# Patient Record
Sex: Male | Born: 1955 | Race: Black or African American | Hispanic: No | Smoking: Current every day smoker
Health system: Southern US, Community
[De-identification: ages and names within clinical notes are randomized; demographics above are authoritative.]

## PROBLEM LIST (undated history)

## (undated) DIAGNOSIS — T7840XA Allergy, unspecified, initial encounter: Secondary | ICD-10-CM

## (undated) DIAGNOSIS — G43909 Migraine, unspecified, not intractable, without status migrainosus: Secondary | ICD-10-CM

## (undated) DIAGNOSIS — R569 Unspecified convulsions: Secondary | ICD-10-CM

---

## 2014-04-15 ENCOUNTER — Emergency Department (HOSPITAL_COMMUNITY)
Admission: EM | Admit: 2014-04-15 | Discharge: 2014-04-15 | Disposition: A | Discharge status: Home / Self Care | Payer: Medicare HMO | Attending: Emergency Medicine | Admitting: Emergency Medicine

## 2014-04-15 ENCOUNTER — Encounter (HOSPITAL_COMMUNITY): Payer: Self-pay | Admitting: Emergency Medicine

## 2014-04-15 ENCOUNTER — Emergency Department (HOSPITAL_COMMUNITY): Payer: Medicare HMO

## 2014-04-15 DIAGNOSIS — G40909 Epilepsy, unspecified, not intractable, without status epilepticus: Secondary | ICD-10-CM | POA: Insufficient documentation

## 2014-04-15 DIAGNOSIS — R0981 Nasal congestion: Secondary | ICD-10-CM | POA: Diagnosis not present

## 2014-04-15 DIAGNOSIS — R51 Headache: Secondary | ICD-10-CM | POA: Diagnosis present

## 2014-04-15 DIAGNOSIS — G43909 Migraine, unspecified, not intractable, without status migrainosus: Secondary | ICD-10-CM | POA: Diagnosis not present

## 2014-04-15 DIAGNOSIS — Z79899 Other long term (current) drug therapy: Secondary | ICD-10-CM | POA: Diagnosis not present

## 2014-04-15 DIAGNOSIS — Z720 Tobacco use: Secondary | ICD-10-CM | POA: Insufficient documentation

## 2014-04-15 DIAGNOSIS — Z7951 Long term (current) use of inhaled steroids: Secondary | ICD-10-CM | POA: Insufficient documentation

## 2014-04-15 DIAGNOSIS — L299 Pruritus, unspecified: Secondary | ICD-10-CM | POA: Diagnosis not present

## 2014-04-15 DIAGNOSIS — R519 Headache, unspecified: Secondary | ICD-10-CM

## 2014-04-15 HISTORY — DX: Allergy, unspecified, initial encounter: T78.40XA

## 2014-04-15 HISTORY — DX: Migraine, unspecified, not intractable, without status migrainosus: G43.909

## 2014-04-15 HISTORY — DX: Unspecified convulsions: R56.9

## 2014-04-15 LAB — COMPREHENSIVE METABOLIC PANEL
ALBUMIN: 4.2 g/dL (ref 3.5–5.2)
ALT: 10 U/L (ref 0–53)
AST: 34 U/L (ref 0–37)
Alkaline Phosphatase: 56 U/L (ref 39–117)
Anion gap: 7 (ref 5–15)
BILIRUBIN TOTAL: 1.4 mg/dL — AB (ref 0.3–1.2)
BUN: 11 mg/dL (ref 6–23)
CALCIUM: 9.4 mg/dL (ref 8.4–10.5)
CO2: 25 mmol/L (ref 19–32)
Chloride: 109 mmol/L (ref 96–112)
Creatinine, Ser: 1.06 mg/dL (ref 0.50–1.35)
GFR calc Af Amer: 88 mL/min — ABNORMAL LOW (ref >90)
GFR calc non Af Amer: 76 mL/min — ABNORMAL LOW (ref >90)
Glucose, Bld: 84 mg/dL (ref 70–99)
POTASSIUM: 7 mmol/L — AB (ref 3.5–5.1)
Sodium: 141 mmol/L (ref 135–145)
Total Protein: 7.1 g/dL (ref 6.0–8.3)

## 2014-04-15 LAB — CBC WITH DIFFERENTIAL/PLATELET
Basophils Absolute: 0 10*3/uL (ref 0.0–0.1)
Basophils Relative: 0 % (ref 0–1)
Eosinophils Absolute: 0 10*3/uL (ref 0.0–0.7)
Eosinophils Relative: 0 % (ref 0–5)
HCT: 46.6 % (ref 39.0–52.0)
HEMOGLOBIN: 16.7 g/dL (ref 13.0–17.0)
LYMPHS PCT: 38 % (ref 12–46)
Lymphs Abs: 1.2 10*3/uL (ref 0.7–4.0)
MCH: 34.6 pg — AB (ref 26.0–34.0)
MCHC: 35.8 g/dL (ref 30.0–36.0)
MCV: 96.5 fL (ref 78.0–100.0)
MONO ABS: 0.4 10*3/uL (ref 0.1–1.0)
Monocytes Relative: 14 % — ABNORMAL HIGH (ref 3–12)
NEUTROS PCT: 48 % (ref 43–77)
Neutro Abs: 1.6 10*3/uL — ABNORMAL LOW (ref 1.7–7.7)
Platelets: 145 10*3/uL — ABNORMAL LOW (ref 150–400)
RBC: 4.83 MIL/uL (ref 4.22–5.81)
RDW: 13.9 % (ref 11.5–15.5)
WBC: 3.3 10*3/uL — AB (ref 4.0–10.5)

## 2014-04-15 LAB — POTASSIUM: POTASSIUM: 4.2 mmol/L (ref 3.5–5.1)

## 2014-04-15 MED ORDER — FENTANYL CITRATE 0.05 MG/ML IJ SOLN
25.0000 ug | Freq: Once | INTRAMUSCULAR | Status: AC
  Administered 2014-04-15: 25 ug via INTRAVENOUS
  Filled 2014-04-15: qty 2

## 2014-04-15 MED ORDER — TETRACAINE HCL 0.5 % OP SOLN
2.0000 [drp] | Freq: Once | OPHTHALMIC | Status: AC
  Administered 2014-04-15: 2 [drp] via OPHTHALMIC
  Filled 2014-04-15: qty 2

## 2014-04-15 MED ORDER — TRAMADOL HCL 50 MG PO TABS: 50.0000 mg | ORAL_TABLET | Freq: Four times a day (QID) | ORAL | Status: AC | PRN

## 2014-04-15 MED ORDER — NAPROXEN 500 MG PO TABS: 500.0000 mg | ORAL_TABLET | Freq: Two times a day (BID) | ORAL | Status: AC

## 2014-04-15 MED ORDER — METOCLOPRAMIDE HCL 5 MG/ML IJ SOLN
10.0000 mg | Freq: Once | INTRAMUSCULAR | Status: AC
  Administered 2014-04-15: 10 mg via INTRAVENOUS
  Filled 2014-04-15: qty 2

## 2014-04-15 MED ORDER — KETOROLAC TROMETHAMINE 30 MG/ML IJ SOLN
30.0000 mg | Freq: Once | INTRAMUSCULAR | Status: AC
  Administered 2014-04-15: 30 mg via INTRAVENOUS
  Filled 2014-04-15: qty 1

## 2014-04-15 MED ORDER — DIPHENHYDRAMINE HCL 50 MG/ML IJ SOLN
12.5000 mg | Freq: Once | INTRAMUSCULAR | Status: AC
  Administered 2014-04-15: 12.5 mg via INTRAVENOUS
  Filled 2014-04-15: qty 1

## 2014-04-15 MED ORDER — OXYCODONE-ACETAMINOPHEN 5-325 MG PO TABS
1.0000 | ORAL_TABLET | Freq: Once | ORAL | Status: AC
  Administered 2014-04-15: 1 via ORAL
  Filled 2014-04-15: qty 1

## 2014-04-15 NOTE — ED Notes (Signed)
States pain is easing and he has been falling asleep but when we knock on the door the pain comes back.

## 2014-04-15 NOTE — ED Notes (Signed)
Pt. Stated, I woke up around 200 for headache and its still there A couple of BC no help.

## 2014-04-15 NOTE — ED Notes (Signed)
Patient transported to CT 

## 2014-04-15 NOTE — ED Provider Notes (Signed)
CSN: [id number]     Arrival date & time 04/15/14  1014 History   First MD Initiated Contact with Patient 04/15/14 1142     Chief Complaint  Patient presents with  . Headache     (Consider location/radiation/quality/duration/timing/severity/associated sxs/prior Treatment) HPI David Hill is a 59 y.o. male with hx of migraine headaches, seizures, allergies, presents to ED with complaint of headache. Pt states he has daily headaches. Reports that he used to be on imitrex [place] not been [place] any. He normally [facility] powder and has taken it for this headache as well. Pt states that he had a headache yesterday all day, as he normally does, but states it worsened around 2am. States he has developed pressure behind both eyes and forehead. Reports gradual in onset. He reports intermittent blurred vision since then. Reports some nasal congestion and blowing his nose. No N/V. No dizziness. No photophobia. States eyes feel itchy and he has been rubbing them. Denies numbness or weakness in extremities. No difficulty walking or speaking. Denies fever, chills, neck pain or stiffness.  Per daughter, pt normally does not complain about his headaches, just takes [facility], but states today he was unable to tolerate it.   Past Medical History  Diagnosis Date  . Allergy   . Migraine   . Seizures    History reviewed. No pertinent past surgical history. No family history on file. History  Substance Use Topics  . Smoking status: Current Every Day Smoker  . Smokeless tobacco: Not on file  . Alcohol Use: No    Review of Systems  Constitutional: Negative for fever and chills.  HENT: Positive for congestion. Negative for ear pain and mouth sores.   Eyes: Positive for pain and itching. Negative for photophobia.  Respiratory: Negative for cough, chest tightness and shortness of breath.   Cardiovascular: Negative for chest pain, palpitations and leg swelling.  Gastrointestinal:  Negative for nausea, vomiting, abdominal pain, diarrhea and abdominal distention.  Genitourinary: Negative for dysuria, urgency, frequency and hematuria.  Musculoskeletal: Negative for myalgias, arthralgias, neck pain and neck stiffness.  Skin: Negative for rash.  Allergic/Immunologic: Negative for immunocompromised state.  Neurological: Positive for headaches. Negative for dizziness, weakness, light-headedness and numbness.  All other systems reviewed and are negative.     Allergies  Iodine  Home Medications   Prior to Admission medications   Medication Sig Start Date End Date Taking? Authorizing Provider  Aspirin-Salicylamide-Caffeine (BC HEADACHE POWDER PO) Take 1 packet by mouth daily as needed (headache).    Yes Historical Provider, MD  fluticasone (FLONASE) 50 MCG/ACT nasal spray Place 1 spray into both nostrils daily.  03/26/14  Yes Historical Provider, MD  levETIRAcetam (KEPPRA) 500 MG tablet Take 500 mg by mouth 2 (two) times daily. 03/26/14  Yes Historical Provider, MD  memantine (NAMENDA) 10 MG tablet Take 10 mg by mouth 2 (two) times daily. 03/26/14  Yes Historical Provider, MD  SYMBICORT 160-4.5 MCG/ACT inhaler Inhale 2 puffs into the lungs daily.  01/23/14  Yes Historical Provider, MD  VENTOLIN HFA 108 (90 BASE) MCG/ACT inhaler 1-2 puffs every 6 (six) hours as needed for wheezing or shortness of breath.  03/21/14  Yes Historical Provider, MD   BP 129/97 mmHg  Pulse 91  Temp(Src) 98.4 F (36.9 C)  Resp 17  Ht 6\' 2"  (1.88 m)  Wt 134 lb (60.782 kg)  BMI 17.20 kg/m2  SpO2 97% Physical Exam  Constitutional: He is oriented to person, place, and  time. He appears well-developed and well-nourished. No distress.  HENT:  Head: Normocephalic and atraumatic.  Right Ear: External ear normal.  Left Ear: External ear normal.  Mouth/Throat: Oropharynx is clear and moist.  Tenderness over frontal and maxillary sinuses bilaterally.   Eyes: Conjunctivae and EOM are normal. Pupils are  equal, round, and reactive to light.  Neck: Normal range of motion. Neck supple.  Cardiovascular: Normal rate, regular rhythm and normal heart sounds.   Pulmonary/Chest: Effort normal. No respiratory distress. He has no wheezes. He has no rales.  Abdominal: Soft. Bowel sounds are normal. He exhibits no distension. There is no tenderness. There is no rebound.  Musculoskeletal: He exhibits no edema.  Neurological: He is alert and oriented to person, place, and time. No cranial nerve deficit. Coordination normal.  5/5 and equal upper and lower extremity strength bilaterally. Equal grip strength bilaterally. Normal finger to nose and heel to shin. No pronator drift. Patellar reflexes 2+. Gait is normal.    Skin: Skin is warm and dry.  Nursing note and vitals reviewed.   ED Course  Procedures (including critical care time) Labs Review Labs Reviewed  CBC WITH DIFFERENTIAL/PLATELET - Abnormal; Notable for the following:    WBC 3.3 (*)    MCH 34.6 (*)    Platelets 145 (*)    Neutro Abs 1.6 (*)    Monocytes Relative 14 (*)    All other components within normal limits  COMPREHENSIVE METABOLIC PANEL - Abnormal; Notable for the following:    Potassium 7.0 (*)    Total Bilirubin 1.4 (*)    GFR calc non Af Amer 76 (*)    GFR calc Af Amer 88 (*)    All other components within normal limits  POTASSIUM    Imaging Review Ct Head Wo Contrast  04/15/2014   CLINICAL DATA:  Migraine headache.  EXAM: CT HEAD WITHOUT CONTRAST  TECHNIQUE: Contiguous axial images were obtained from the base of the skull through the vertex without intravenous contrast.  COMPARISON:  None.  FINDINGS: Mild diffuse cortical atrophy is noted. No mass effect or midline shift is noted. Ventricular size is within normal limits. There is no evidence of mass lesion, hemorrhage or acute infarction.  IMPRESSION: Mild diffuse cortical atrophy. No acute intracranial abnormality seen.   Electronically Signed   By: Lupita Raider, M.D.    On: 04/15/2014 13:21     EKG Interpretation None      MDM   Final diagnoses:  Nonintractable headache, unspecified chronicity pattern, unspecified headache type    Pt with gradual onset of worsening headache. States has daily headaches, takes BCs for it multiple times a day. Some visual changes reported. Pt's ocular pressures are 16 -R and 12- L. He is in no distress. Afebrile. No meningismus. No vomiting. Normal neurological exam. Will get CT head given worsening headache and visual changes.   Migraine cocktail ordered -toradol , reglan , benadryl 12.5mg .    2:30 PM Not much improvement after migraine cocktail. Labs unremarkable. CT negative. Doubt ICH, given daily headaches. Vision now back to normal. Hx of migraines. Most likely complex migraine. Will try percocet.    3:00PM No improvement with percocet. Fentanyl ordered.  3:48 PM Pt feeling much better. Headache improved. He is drinking fluids. NAD. Smiling. Follow up with neurology given daily headaches.   Filed Vitals:   04/15/14 1136 04/15/14 1300 04/15/14 1445 04/15/14 1527  BP:  131/95 122/92 120/91  Pulse:  102 83 72  Temp:  Resp:    16  Height:      Weight:      SpO2: 97% 95% 96% 97%     Lottie Mussel, PA-C 04/15/14 1623  Lottie Mussel, PA-C 04/15/14 1624  Arby Barrette, MD 04/25/14 [phone]

## 2014-04-15 NOTE — Discharge Instructions (Signed)
Naprosyn and ultram for pain as prescribed. Follow up with neurology. Return if worsening symptoms.    Migraine Headache A migraine headache is an intense, throbbing pain on one or both sides of your head. A migraine can last for 30 minutes to several hours. CAUSES  The exact cause of a migraine headache is not always known. However, a migraine may be caused when nerves in the brain become irritated and release chemicals that cause inflammation. This causes pain. Certain things may also trigger migraines, such as:  Alcohol.  Smoking.  Stress.  Menstruation.  Aged cheeses.  Foods or drinks that contain nitrates, glutamate, aspartame, or tyramine.  Lack of sleep.  Chocolate.  Caffeine.  Hunger.  Physical exertion.  Fatigue.  Medicines used to treat chest pain (nitroglycerine), birth control pills, estrogen, and some blood pressure medicines. SIGNS AND SYMPTOMS  Pain on one or both sides of your head.  Pulsating or throbbing pain.  Severe pain that prevents daily activities.  Pain that is aggravated by any physical activity.  Nausea, vomiting, or both.  Dizziness.  Pain with exposure to bright lights, loud noises, or activity.  General sensitivity to bright lights, loud noises, or smells. Before you get a migraine, you may get warning signs that a migraine is coming (aura). An aura may include:  Seeing flashing lights.  Seeing bright spots, halos, or zigzag lines.  Having tunnel vision or blurred vision.  Having feelings of numbness or tingling.  Having trouble talking.  Having muscle weakness. DIAGNOSIS  A migraine headache is often diagnosed based on:  Symptoms.  Physical exam.  A CT scan or MRI of your head. These imaging tests cannot diagnose migraines, but they can help rule out other causes of headaches. TREATMENT Medicines may be given for pain and nausea. Medicines can also be given to help prevent recurrent migraines.  HOME CARE  INSTRUCTIONS  Only take over-the-counter or prescription medicines for pain or discomfort as directed by your health care provider. The use of long-term narcotics is not recommended.  Lie down in a dark, quiet room when you have a migraine.  Keep a journal to find out what may trigger your migraine headaches. For example, write down:  What you eat and drink.  How much sleep you get.  Any change to your diet or medicines.  Limit alcohol consumption.  Quit smoking if you smoke.  Get 7-9 hours of sleep, or as recommended by your health care provider.  Limit stress.  Keep lights dim if bright lights bother you and make your migraines worse. SEEK IMMEDIATE MEDICAL CARE IF:   Your migraine becomes severe.  You have a fever.  You have a stiff neck.  You have vision loss.  You have muscular weakness or loss of muscle control.  You start losing your balance or have trouble walking.  You feel faint or pass out.  You have severe symptoms that are different from your first symptoms. MAKE SURE YOU:   Understand these instructions.  Will watch your condition.  Will get help right away if you are not doing well or get worse. Document Released: 02/02/2005 Document Revised: 06/19/2013 Document Reviewed: 10/10/2012 [facility] Patient Information 2015 [employer]. This information is not intended to replace advice given to you by your health care provider. Make sure you discuss any questions you have with your health care provider.

## 2016-03-11 IMAGING — CT CT HEAD W/O CM
1 series · 16 of 30 positions shown, 20 images · non-contrast
Comparison: None.

CLINICAL DATA: Migraine headache.

EXAM:
CT HEAD WITHOUT CONTRAST
TECHNIQUE: Contiguous axial images were obtained from the base of the skull
through the vertex without intravenous contrast.

[Series 2: head 5.0 h30s · axial · 0.43mm/px · z∈[-254,-114]mm · 16 of 32 slices shown, 20 images]
[im 2/32  brain]
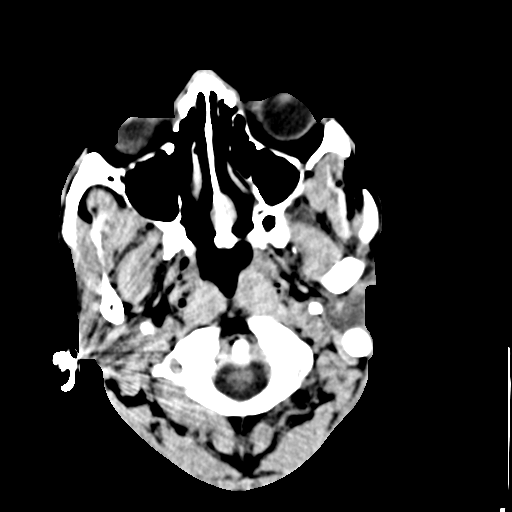
[im 2/32  bone]
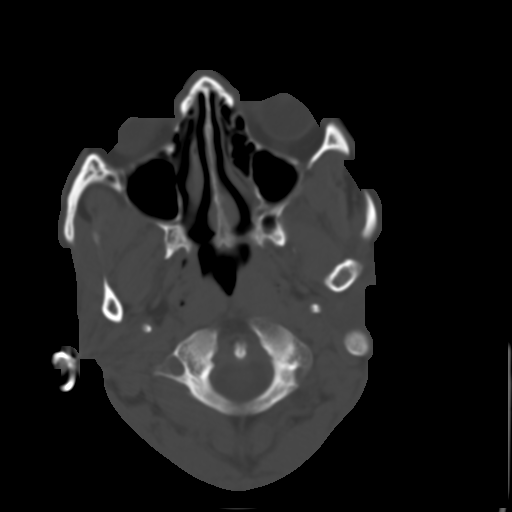
[im 4/32  brain]
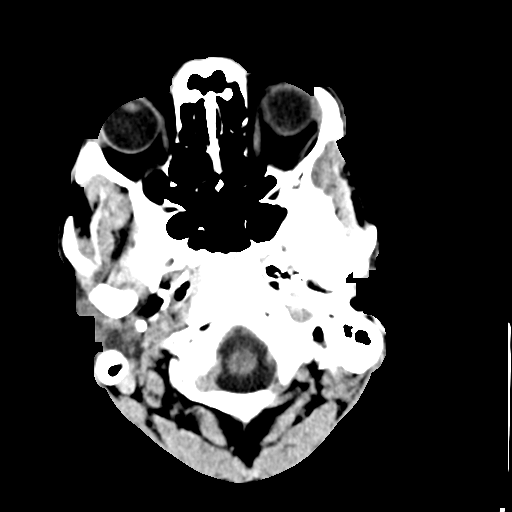
[im 6/32  brain]
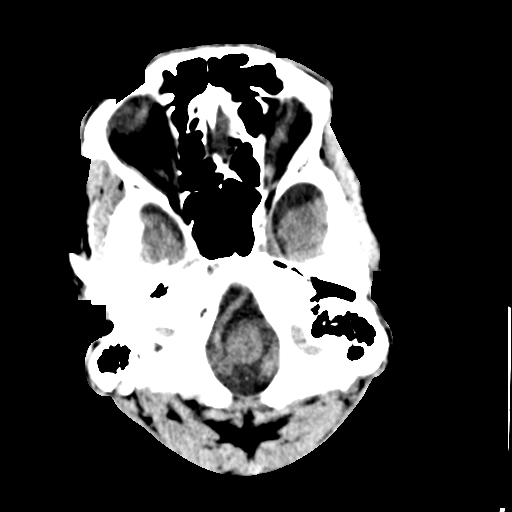
[im 8/32  brain]
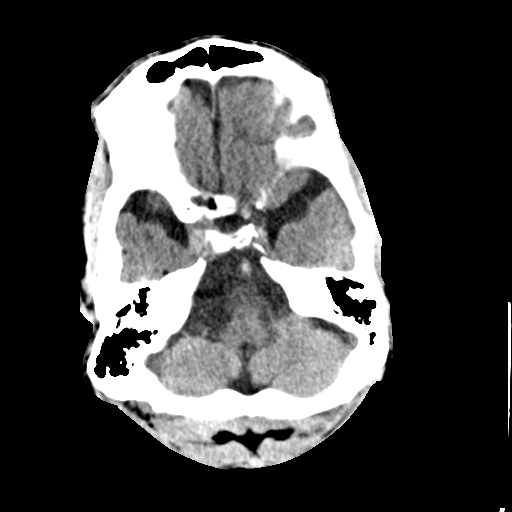
[im 9/32  brain]
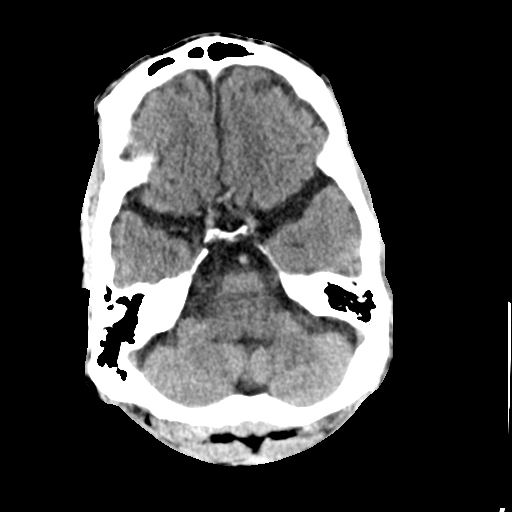
[im 9/32  bone]
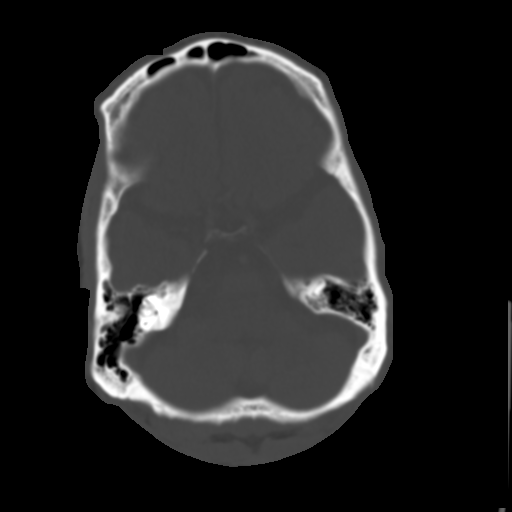
[im 11/32  brain]
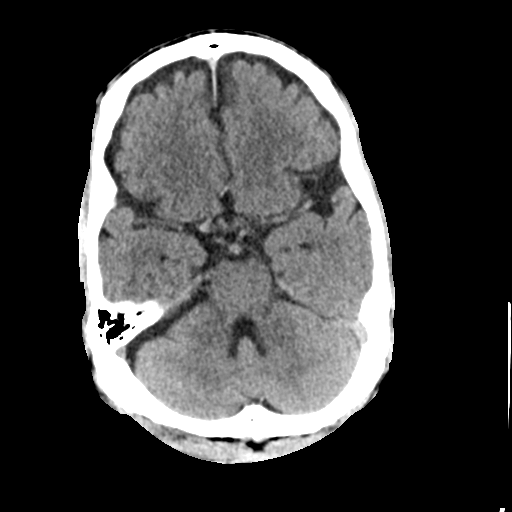
[im 13/32  brain]
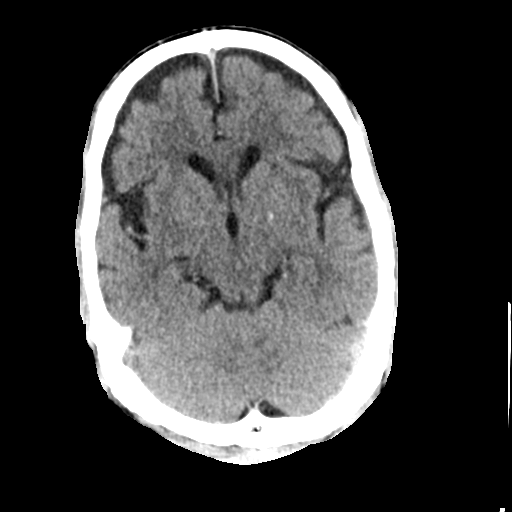
[im 15/32  brain]
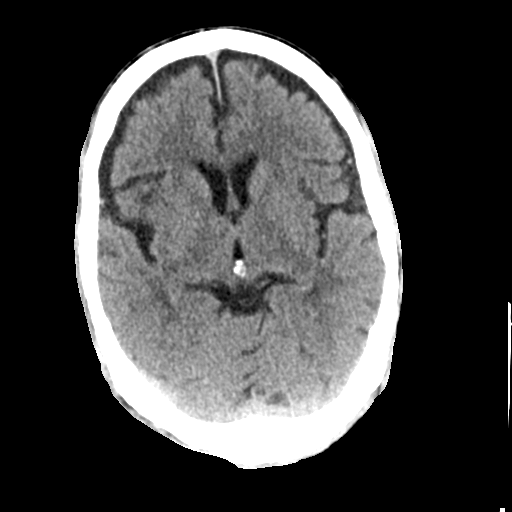
[im 17/32  brain]
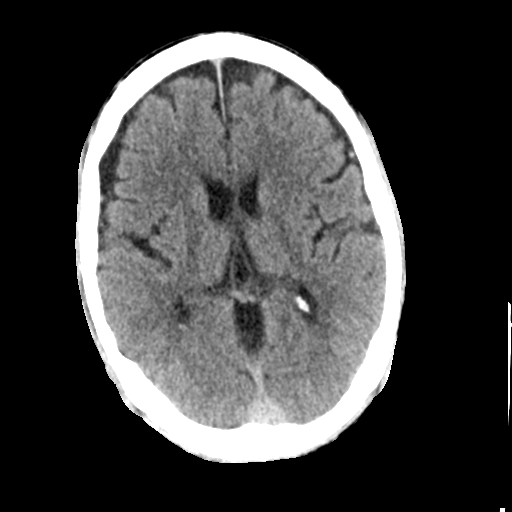
[im 17/32  bone]
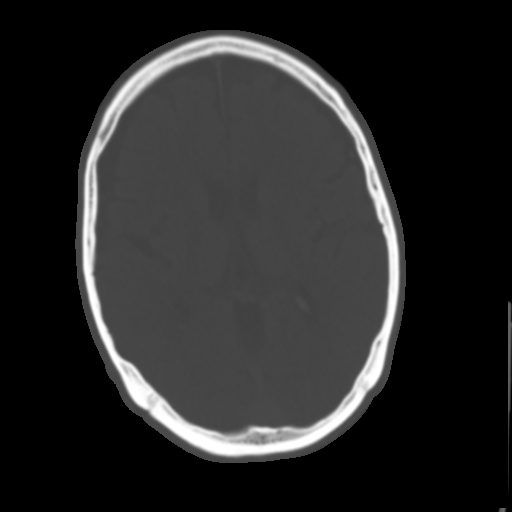
[im 19/32  brain]
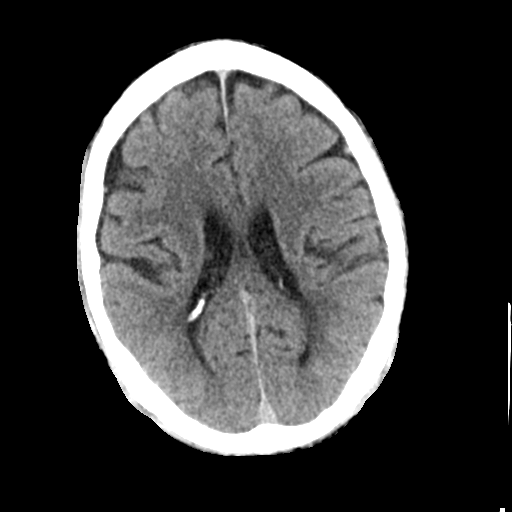
[im 21/32  brain]
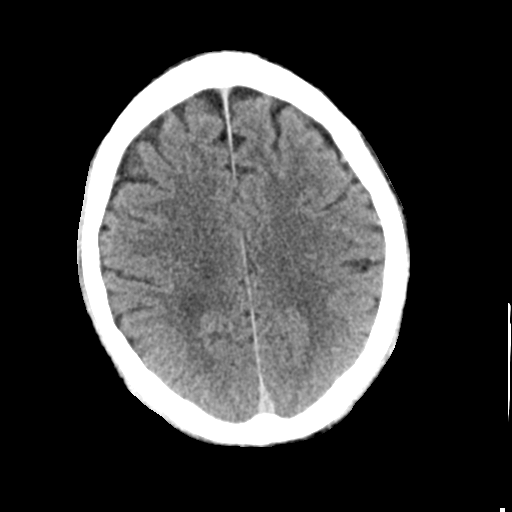
[im 23/32  brain]
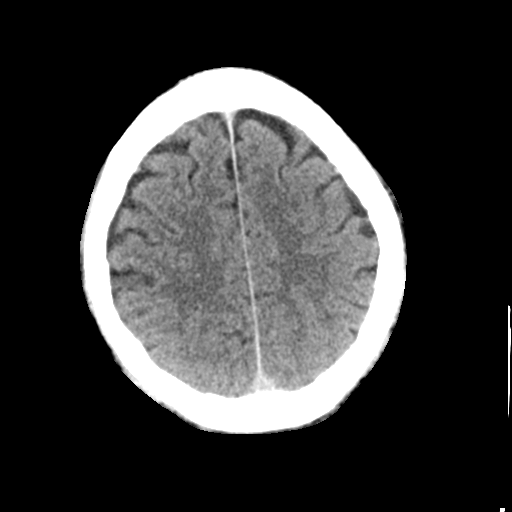
[im 24/32  brain]
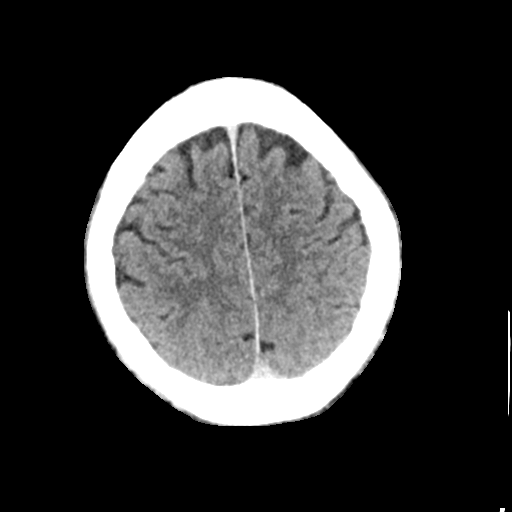
[im 24/32  bone]
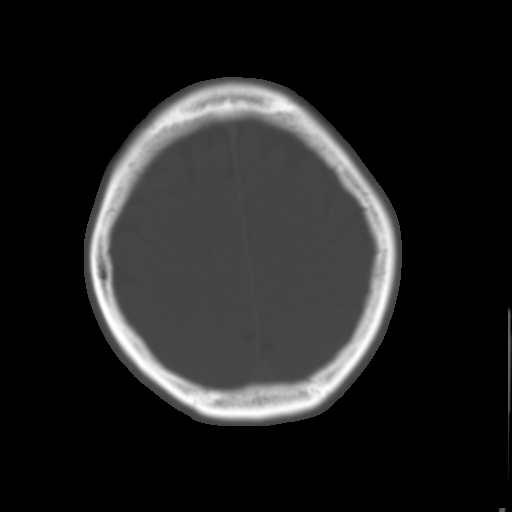
[im 26/32  brain]
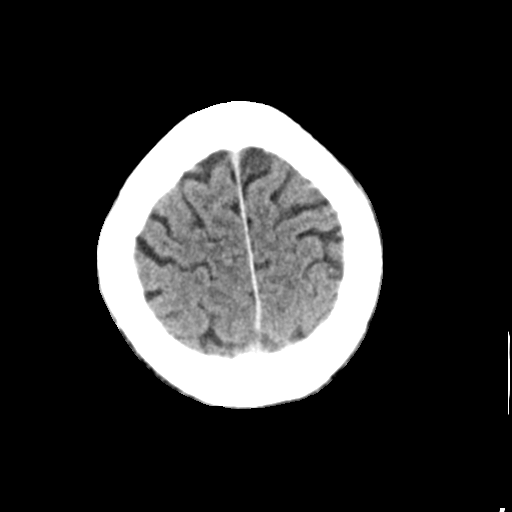
[im 28/32  brain]
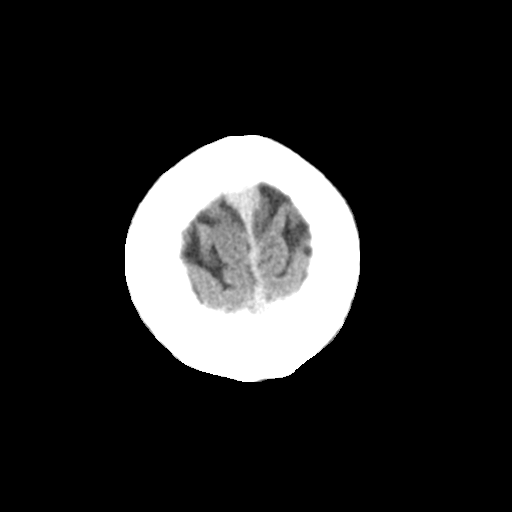
[im 30/32  brain]
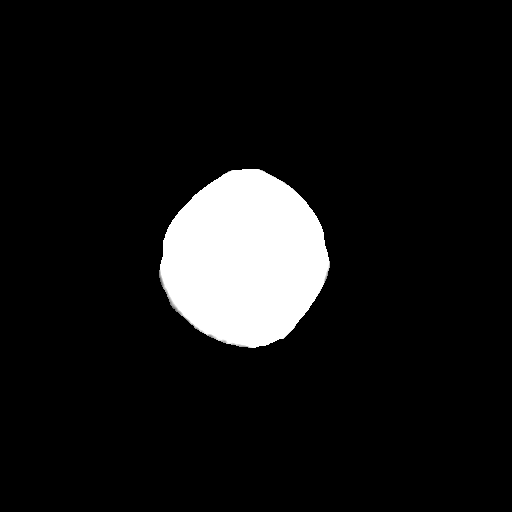

[16 of 30 positions shown; findings below may reference images not displayed]

FINDINGS: Mild diffuse cortical atrophy is noted. No mass effect or midline
shift is noted. Ventricular size is within normal limits. There is
no evidence of mass lesion, hemorrhage or acute infarction.
IMPRESSION: Mild diffuse cortical atrophy. No acute intracranial abnormality
seen.
